# Patient Record
Sex: Female | Born: 1970 | Race: White | Hispanic: No | Marital: Single | Smoking: Never smoker
Health system: Southern US, Community
[De-identification: ages and names within clinical notes are randomized; demographics above are authoritative.]

## PROBLEM LIST (undated history)

## (undated) HISTORY — PX: APPENDECTOMY: SHX54

## (undated) HISTORY — PX: ABDOMINAL HYSTERECTOMY: SHX81

---

## 2014-08-29 ENCOUNTER — Emergency Department (HOSPITAL_COMMUNITY): Payer: Commercial Managed Care - PPO

## 2014-08-29 ENCOUNTER — Emergency Department (HOSPITAL_COMMUNITY)
Admission: EM | Admit: 2014-08-29 | Discharge: 2014-08-29 | Disposition: A | Discharge status: Home / Self Care | Payer: Commercial Managed Care - PPO | Attending: Emergency Medicine | Admitting: Emergency Medicine

## 2014-08-29 ENCOUNTER — Encounter (HOSPITAL_COMMUNITY): Payer: Self-pay | Admitting: Emergency Medicine

## 2014-08-29 DIAGNOSIS — S161XXA Strain of muscle, fascia and tendon at neck level, initial encounter: Secondary | ICD-10-CM | POA: Diagnosis not present

## 2014-08-29 DIAGNOSIS — M79632 Pain in left forearm: Secondary | ICD-10-CM

## 2014-08-29 DIAGNOSIS — Y9389 Activity, other specified: Secondary | ICD-10-CM | POA: Insufficient documentation

## 2014-08-29 DIAGNOSIS — S199XXA Unspecified injury of neck, initial encounter: Secondary | ICD-10-CM | POA: Diagnosis present

## 2014-08-29 DIAGNOSIS — Y999 Unspecified external cause status: Secondary | ICD-10-CM | POA: Insufficient documentation

## 2014-08-29 DIAGNOSIS — S30811A Abrasion of abdominal wall, initial encounter: Secondary | ICD-10-CM | POA: Diagnosis not present

## 2014-08-29 DIAGNOSIS — Y92410 Unspecified street and highway as the place of occurrence of the external cause: Secondary | ICD-10-CM | POA: Insufficient documentation

## 2014-08-29 DIAGNOSIS — S59912A Unspecified injury of left forearm, initial encounter: Secondary | ICD-10-CM | POA: Insufficient documentation

## 2014-08-29 MED ORDER — OXYCODONE-ACETAMINOPHEN 5-325 MG PO TABS
1.0000 | ORAL_TABLET | Freq: Once | ORAL | Status: AC
  Administered 2014-08-29: 1 via ORAL
  Filled 2014-08-29: qty 1

## 2014-08-29 MED ORDER — IOHEXOL 300 MG/ML  SOLN
100.0000 mL | Freq: Once | INTRAMUSCULAR | Status: AC | PRN
  Administered 2014-08-29: 100 mL via INTRAVENOUS

## 2014-08-29 MED ORDER — OXYCODONE-ACETAMINOPHEN 5-325 MG PO TABS: 1.0000 | ORAL_TABLET | ORAL | Status: AC | PRN
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
## 2014-08-29 NOTE — Discharge Instructions (Signed)
Cervical Sprain °A cervical sprain is an injury in the neck in which the strong, fibrous tissues (ligaments) that connect your neck bones stretch or tear. Cervical sprains can range from mild to severe. Severe cervical sprains can cause the neck vertebrae to be unstable. This can lead to damage of the spinal cord and can result in serious nervous system problems. The amount of time it takes for a cervical sprain to get better depends on the cause and extent of the injury. Most cervical sprains heal in 1 to 3 weeks. °CAUSES  °Severe cervical sprains may be caused by:  °· Contact sport injuries (such as from football, rugby, wrestling, hockey, auto racing, gymnastics, diving, martial arts, or boxing).   °· Motor vehicle collisions.   °· Whiplash injuries. This is an injury from a sudden forward and backward whipping movement of the head and neck.  °· Falls.   °Mild cervical sprains may be caused by:  °· Being in an awkward position, such as while cradling a telephone between your ear and shoulder.   °· Sitting in a chair that does not offer proper support.   °· Working at a poorly designed computer station.   °· Looking up or down for long periods of time.   °SYMPTOMS  °· Pain, soreness, stiffness, or a burning sensation in the front, back, or sides of the neck. This discomfort may develop immediately after the injury or slowly, 24 hours or more after the injury.   °· Pain or tenderness directly in the middle of the back of the neck.   °· Shoulder or upper back pain.   °· Limited ability to move the neck.   °· Headache.   °· Dizziness.   °· Weakness, numbness, or tingling in the hands or arms.   °· Muscle spasms.   °· Difficulty swallowing or chewing.   °· Tenderness and swelling of the neck.   °DIAGNOSIS  °Most of the time your health care provider can diagnose a cervical sprain by taking your history and doing a physical exam. Your health care provider will ask about previous neck injuries and any known neck  problems, such as arthritis in the neck. X-rays may be taken to find out if there are any other problems, such as with the bones of the neck. Other tests, such as a CT scan or MRI, may also be needed.  °TREATMENT  °Treatment depends on the severity of the cervical sprain. Mild sprains can be treated with rest, keeping the neck in place (immobilization), and pain medicines. Severe cervical sprains are immediately immobilized. Further treatment is done to help with pain, muscle spasms, and other symptoms and may include: °· Medicines, such as pain relievers, numbing medicines, or muscle relaxants.   °· Physical therapy. This may involve stretching exercises, strengthening exercises, and posture training. Exercises and improved posture can help stabilize the neck, strengthen muscles, and help stop symptoms from returning.   °HOME CARE INSTRUCTIONS  °· Put ice on the injured area.   °¨ Put ice in a plastic bag.   °¨ Place a towel between your skin and the bag.   °¨ Leave the ice on for 15-20 minutes, 3-4 times a day.   °· If your injury was severe, you may have been given a cervical collar to wear. A cervical collar is a two-piece collar designed to keep your neck from moving while it heals. °¨ Do not remove the collar unless instructed by your health care provider. °¨ If you have long hair, keep it outside of the collar. °¨ Ask your health care provider before making any adjustments to your collar. Minor   adjustments may be required over time to improve comfort and reduce pressure on your chin or on the back of your head.  Ifyou are allowed to remove the collar for cleaning or bathing, follow your health care provider's instructions on how to do so safely.  Keep your collar clean by wiping it with mild soap and water and drying it completely. If the collar you have been given includes removable pads, remove them every 1-2 days and hand wash them with soap and water. Allow them to air dry. They should be completely  dry before you wear them in the collar.  If you are allowed to remove the collar for cleaning and bathing, wash and dry the skin of your neck. Check your skin for irritation or sores. If you see any, tell your health care provider.  Do not drive while wearing the collar.   Only take over-the-counter or prescription medicines for pain, discomfort, or fever as directed by your health care provider.   Keep all follow-up appointments as directed by your health care provider.   Keep all physical therapy appointments as directed by your health care provider.   Make any needed adjustments to your workstation to promote good posture.   Avoid positions and activities that make your symptoms worse.   Warm up and stretch before being active to help prevent problems.  SEEK MEDICAL CARE IF:   Your pain is not controlled with medicine.   You are unable to decrease your pain medicine over time as planned.   Your activity level is not improving as expected.  SEEK IMMEDIATE MEDICAL CARE IF:   You develop any bleeding.  You develop stomach upset.  You have signs of an allergic reaction to your medicine.   Your symptoms get worse.   You develop new, unexplained symptoms.   You have numbness, tingling, weakness, or paralysis in any part of your body.  MAKE SURE YOU:   Understand these instructions.  Will watch your condition.  Will get help right away if you are not doing well or get worse. Document Released: 01/02/2007 Document Revised: 03/12/2013 Document Reviewed: 09/12/2012 [facility] Patient Information 2015 [place], [place]. This information is not intended to replace advice given to you by your health care provider. Make sure you discuss any questions you have with your health care provider.  Please use medication as directed, follow-up primary care provider in one week if symptoms continue to persist, follow-up sooner as needed. Please monitor for new or worsening signs  or symptoms return immediately if any present.
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
## 2021-03-05 ENCOUNTER — Ambulatory Visit: Payer: BC Managed Care – PPO | Admitting: Sports Medicine

## 2021-04-02 ENCOUNTER — Ambulatory Visit: Payer: BC Managed Care – PPO | Admitting: Sports Medicine

## 2021-04-13 ENCOUNTER — Ambulatory Visit: Payer: BC Managed Care – PPO | Admitting: Sports Medicine

## 2023-06-05 ENCOUNTER — Other Ambulatory Visit: Payer: Self-pay | Admitting: Family

## 2023-06-05 DIAGNOSIS — Z1231 Encounter for screening mammogram for malignant neoplasm of breast: Secondary | ICD-10-CM
